# Patient Record
Sex: Female | Born: 1960 | Race: Black or African American | Hispanic: No | Smoking: Never smoker
Health system: Southern US, Community
[De-identification: ages and names within clinical notes are randomized; demographics above are authoritative.]

## PROBLEM LIST (undated history)

## (undated) ENCOUNTER — Emergency Department (HOSPITAL_BASED_OUTPATIENT_CLINIC_OR_DEPARTMENT_OTHER): Admission: EM | Payer: BC Managed Care – PPO | Source: Home / Self Care

## (undated) DIAGNOSIS — I10 Essential (primary) hypertension: Secondary | ICD-10-CM

## (undated) DIAGNOSIS — K219 Gastro-esophageal reflux disease without esophagitis: Secondary | ICD-10-CM

## (undated) DIAGNOSIS — N6452 Nipple discharge: Secondary | ICD-10-CM

---

## 1999-10-28 ENCOUNTER — Other Ambulatory Visit: Admission: RE | Admit: 1999-10-28 | Discharge: 1999-10-28 | Payer: Self-pay | Admitting: Obstetrics and Gynecology

## 2000-12-03 ENCOUNTER — Other Ambulatory Visit: Admission: RE | Admit: 2000-12-03 | Discharge: 2000-12-03 | Payer: Self-pay | Admitting: Obstetrics and Gynecology

## 2002-07-04 ENCOUNTER — Other Ambulatory Visit: Admission: RE | Admit: 2002-07-04 | Discharge: 2002-07-04 | Payer: Self-pay | Admitting: Obstetrics and Gynecology

## 2002-07-21 ENCOUNTER — Encounter: Admission: RE | Admit: 2002-07-21 | Discharge: 2002-07-21 | Payer: Self-pay | Admitting: Obstetrics and Gynecology

## 2002-07-21 ENCOUNTER — Encounter: Payer: Self-pay | Admitting: Obstetrics and Gynecology

## 2004-05-29 ENCOUNTER — Other Ambulatory Visit: Admission: RE | Admit: 2004-05-29 | Discharge: 2004-05-29 | Payer: Self-pay | Admitting: Obstetrics and Gynecology

## 2004-06-16 ENCOUNTER — Encounter: Admission: RE | Admit: 2004-06-16 | Discharge: 2004-06-16 | Payer: Self-pay | Admitting: Obstetrics and Gynecology

## 2005-07-30 ENCOUNTER — Other Ambulatory Visit: Admission: RE | Admit: 2005-07-30 | Discharge: 2005-07-30 | Payer: Self-pay | Admitting: Obstetrics and Gynecology

## 2005-07-30 ENCOUNTER — Encounter: Admission: RE | Admit: 2005-07-30 | Discharge: 2005-07-30 | Payer: Self-pay | Admitting: Obstetrics and Gynecology

## 2006-03-15 ENCOUNTER — Ambulatory Visit: Payer: Self-pay | Admitting: Orthopedic Surgery

## 2006-05-27 ENCOUNTER — Ambulatory Visit: Payer: Self-pay | Admitting: Orthopedic Surgery

## 2006-06-21 ENCOUNTER — Ambulatory Visit: Payer: Self-pay | Admitting: Orthopedic Surgery

## 2006-07-06 ENCOUNTER — Encounter: Admission: RE | Admit: 2006-07-06 | Discharge: 2006-07-06 | Payer: Self-pay | Admitting: Orthopedic Surgery

## 2006-07-22 ENCOUNTER — Encounter: Admission: RE | Admit: 2006-07-22 | Discharge: 2006-07-22 | Payer: Self-pay | Admitting: Orthopedic Surgery

## 2006-08-05 ENCOUNTER — Encounter: Admission: RE | Admit: 2006-08-05 | Discharge: 2006-08-05 | Payer: Self-pay | Admitting: Orthopedic Surgery

## 2006-08-16 ENCOUNTER — Encounter: Payer: Self-pay | Admitting: Orthopedic Surgery

## 2006-08-17 ENCOUNTER — Ambulatory Visit: Payer: Self-pay | Admitting: Orthopedic Surgery

## 2006-10-12 ENCOUNTER — Ambulatory Visit: Payer: Self-pay | Admitting: Orthopedic Surgery

## 2006-11-11 ENCOUNTER — Ambulatory Visit: Payer: Self-pay | Admitting: Orthopedic Surgery

## 2006-12-09 ENCOUNTER — Ambulatory Visit: Payer: Self-pay | Admitting: Orthopedic Surgery

## 2007-02-15 ENCOUNTER — Ambulatory Visit: Payer: Self-pay | Admitting: Orthopedic Surgery

## 2007-04-18 ENCOUNTER — Ambulatory Visit: Payer: Self-pay | Admitting: Orthopedic Surgery

## 2007-04-18 DIAGNOSIS — IMO0002 Reserved for concepts with insufficient information to code with codable children: Secondary | ICD-10-CM | POA: Insufficient documentation

## 2007-04-18 DIAGNOSIS — M549 Dorsalgia, unspecified: Secondary | ICD-10-CM | POA: Insufficient documentation

## 2007-05-09 ENCOUNTER — Encounter: Payer: Self-pay | Admitting: Orthopedic Surgery

## 2007-05-11 ENCOUNTER — Encounter: Payer: Self-pay | Admitting: Orthopedic Surgery

## 2007-05-17 ENCOUNTER — Telehealth: Payer: Self-pay | Admitting: Orthopedic Surgery

## 2007-05-24 ENCOUNTER — Telehealth: Payer: Self-pay | Admitting: Orthopedic Surgery

## 2007-05-26 ENCOUNTER — Telehealth: Payer: Self-pay | Admitting: Orthopedic Surgery

## 2007-06-09 ENCOUNTER — Ambulatory Visit: Payer: Self-pay | Admitting: Orthopedic Surgery

## 2007-08-22 ENCOUNTER — Telehealth: Payer: Self-pay | Admitting: Orthopedic Surgery

## 2007-08-22 ENCOUNTER — Encounter: Payer: Self-pay | Admitting: Orthopedic Surgery

## 2007-08-23 ENCOUNTER — Encounter: Payer: Self-pay | Admitting: Orthopedic Surgery

## 2007-09-06 ENCOUNTER — Ambulatory Visit: Payer: Self-pay | Admitting: Orthopedic Surgery

## 2007-09-06 DIAGNOSIS — M79609 Pain in unspecified limb: Secondary | ICD-10-CM | POA: Insufficient documentation

## 2007-10-11 ENCOUNTER — Encounter: Payer: Self-pay | Admitting: Orthopedic Surgery

## 2007-12-12 ENCOUNTER — Ambulatory Visit: Payer: Self-pay | Admitting: Orthopedic Surgery

## 2008-01-11 ENCOUNTER — Telehealth: Payer: Self-pay | Admitting: Orthopedic Surgery

## 2010-05-25 ENCOUNTER — Encounter: Payer: Self-pay | Admitting: Orthopedic Surgery

## 2012-05-11 ENCOUNTER — Other Ambulatory Visit: Payer: Self-pay | Admitting: Family Medicine

## 2012-05-11 DIAGNOSIS — Z1231 Encounter for screening mammogram for malignant neoplasm of breast: Secondary | ICD-10-CM

## 2012-06-13 ENCOUNTER — Ambulatory Visit
Admission: RE | Admit: 2012-06-13 | Discharge: 2012-06-13 | Disposition: A | Discharge status: Home / Self Care | Payer: BC Managed Care – PPO | Source: Ambulatory Visit | Attending: Family Medicine | Admitting: Family Medicine

## 2012-06-13 DIAGNOSIS — Z1231 Encounter for screening mammogram for malignant neoplasm of breast: Secondary | ICD-10-CM

## 2013-04-18 ENCOUNTER — Other Ambulatory Visit: Payer: Self-pay

## 2013-04-18 DIAGNOSIS — Z1231 Encounter for screening mammogram for malignant neoplasm of breast: Secondary | ICD-10-CM

## 2013-06-15 ENCOUNTER — Ambulatory Visit: Payer: BC Managed Care – PPO

## 2013-06-20 ENCOUNTER — Ambulatory Visit
Admission: RE | Admit: 2013-06-20 | Discharge: 2013-06-20 | Disposition: A | Discharge status: Home / Self Care | Payer: BC Managed Care – PPO | Source: Ambulatory Visit

## 2013-06-20 DIAGNOSIS — Z1231 Encounter for screening mammogram for malignant neoplasm of breast: Secondary | ICD-10-CM

## 2013-09-05 ENCOUNTER — Encounter: Payer: Self-pay | Admitting: Podiatry

## 2013-09-05 ENCOUNTER — Ambulatory Visit (INDEPENDENT_AMBULATORY_CARE_PROVIDER_SITE_OTHER): Payer: BC Managed Care – PPO | Admitting: Podiatry

## 2013-09-05 VITALS — BP 129/89 | HR 78

## 2013-09-05 DIAGNOSIS — M79609 Pain in unspecified limb: Secondary | ICD-10-CM

## 2013-09-05 DIAGNOSIS — M659 Synovitis and tenosynovitis, unspecified: Secondary | ICD-10-CM

## 2013-09-05 DIAGNOSIS — M21969 Unspecified acquired deformity of unspecified lower leg: Secondary | ICD-10-CM | POA: Insufficient documentation
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
## 2013-09-05 NOTE — Patient Instructions (Signed)
Seen for pain in both feet.  Noted of early arthritic change. Noted of unstable first Metatarsal bone. Metatarsal binder x 1 pr dispensed.  Need custom orthotics. Will contact patient with info.

## 2013-09-07 ENCOUNTER — Emergency Department (HOSPITAL_BASED_OUTPATIENT_CLINIC_OR_DEPARTMENT_OTHER)
Admission: EM | Admit: 2013-09-07 | Discharge: 2013-09-07 | Disposition: A | Discharge status: Home / Self Care | Payer: BC Managed Care – PPO | Attending: Emergency Medicine | Admitting: Emergency Medicine

## 2013-09-07 ENCOUNTER — Encounter (HOSPITAL_BASED_OUTPATIENT_CLINIC_OR_DEPARTMENT_OTHER): Payer: Self-pay | Admitting: Emergency Medicine

## 2013-09-07 DIAGNOSIS — I10 Essential (primary) hypertension: Secondary | ICD-10-CM | POA: Insufficient documentation

## 2013-09-07 DIAGNOSIS — K219 Gastro-esophageal reflux disease without esophagitis: Secondary | ICD-10-CM | POA: Insufficient documentation

## 2013-09-07 DIAGNOSIS — R079 Chest pain, unspecified: Secondary | ICD-10-CM | POA: Insufficient documentation

## 2013-09-07 DIAGNOSIS — Z79899 Other long term (current) drug therapy: Secondary | ICD-10-CM | POA: Insufficient documentation

## 2013-09-07 HISTORY — DX: Essential (primary) hypertension: I10

## 2013-09-07 HISTORY — DX: Gastro-esophageal reflux disease without esophagitis: K21.9

## 2013-09-07 LAB — CBC WITH DIFFERENTIAL/PLATELET
Basophils Absolute: 0 10*3/uL (ref 0.0–0.1)
Basophils Relative: 0 % (ref 0–1)
Eosinophils Absolute: 0.1 10*3/uL (ref 0.0–0.7)
Eosinophils Relative: 1 % (ref 0–5)
HCT: 35 % — ABNORMAL LOW (ref 36.0–46.0)
HEMOGLOBIN: 12.2 g/dL (ref 12.0–15.0)
LYMPHS ABS: 1.8 10*3/uL (ref 0.7–4.0)
Lymphocytes Relative: 33 % (ref 12–46)
MCH: 25.7 pg — ABNORMAL LOW (ref 26.0–34.0)
MCHC: 34.9 g/dL (ref 30.0–36.0)
MCV: 73.8 fL — ABNORMAL LOW (ref 78.0–100.0)
MONOS PCT: 8 % (ref 3–12)
Monocytes Absolute: 0.4 10*3/uL (ref 0.1–1.0)
NEUTROS ABS: 3.3 10*3/uL (ref 1.7–7.7)
NEUTROS PCT: 58 % (ref 43–77)
Platelets: 251 10*3/uL (ref 150–400)
RBC: 4.74 MIL/uL (ref 3.87–5.11)
RDW: 16.5 % — ABNORMAL HIGH (ref 11.5–15.5)
WBC: 5.6 10*3/uL (ref 4.0–10.5)

## 2013-09-07 LAB — BASIC METABOLIC PANEL
BUN: 10 mg/dL (ref 6–23)
CHLORIDE: 98 meq/L (ref 96–112)
CO2: 25 mEq/L (ref 19–32)
Calcium: 10 mg/dL (ref 8.4–10.5)
Creatinine, Ser: 0.7 mg/dL (ref 0.50–1.10)
GFR calc Af Amer: >90 mL/min (ref >90)
GFR calc non Af Amer: >90 mL/min (ref >90)
GLUCOSE: 105 mg/dL — AB (ref 70–99)
POTASSIUM: 3.4 meq/L — AB (ref 3.7–5.3)
Sodium: 139 mEq/L (ref 137–147)

## 2013-09-07 LAB — TROPONIN I

## 2013-09-07 NOTE — ED Notes (Signed)
Chest pain since yesterday

## 2013-09-07 NOTE — Discharge Instructions (Signed)
Chest Pain (Nonspecific) °It is often hard to give a specific diagnosis for the cause of chest pain. There is always a chance that your pain could be related to something serious, such as a heart attack or a blood clot in the lungs. You need to follow up with your caregiver for further evaluation. °CAUSES  °· Heartburn. °· Pneumonia or bronchitis. °· Anxiety or stress. °· Inflammation around your heart (pericarditis) or lung (pleuritis or pleurisy). °· A blood clot in the lung. °· A collapsed lung (pneumothorax). It can develop suddenly on its own (spontaneous pneumothorax) or from injury (trauma) to the chest. °· Shingles infection (herpes zoster virus). °The chest wall is composed of bones, muscles, and cartilage. Any of these can be the source of the pain. °· The bones can be bruised by injury. °· The muscles or cartilage can be strained by coughing or overwork. °· The cartilage can be affected by inflammation and become sore (costochondritis). °DIAGNOSIS  °Lab tests or other studies, such as X-rays, electrocardiography, stress testing, or cardiac imaging, may be needed to find the cause of your pain.  °TREATMENT  °· Treatment depends on what may be causing your chest pain. Treatment may include: °· Acid blockers for heartburn. °· Anti-inflammatory medicine. °· Pain medicine for inflammatory conditions. °· Antibiotics if an infection is present. °· You may be advised to change lifestyle habits. This includes stopping smoking and avoiding alcohol, caffeine, and chocolate. °· You may be advised to keep your head raised (elevated) when sleeping. This reduces the chance of acid going backward from your stomach into your esophagus. °· Most of the time, nonspecific chest pain will improve within 2 to 3 days with rest and mild pain medicine. °HOME CARE INSTRUCTIONS  °· If antibiotics were prescribed, take your antibiotics as directed. Finish them even if you start to feel better. °· For the next few days, avoid physical  activities that bring on chest pain. Continue physical activities as directed. °· Do not smoke. °· Avoid drinking alcohol. °· Only take over-the-counter or prescription medicine for pain, discomfort, or fever as directed by your caregiver. °· Follow your caregiver's suggestions for further testing if your chest pain does not go away. °· Keep any follow-up appointments you made. If you do not go to an appointment, you could develop lasting (chronic) problems with pain. If there is any problem keeping an appointment, you must call to reschedule. °SEEK MEDICAL CARE IF:  °· You think you are having problems from the medicine you are taking. Read your medicine instructions carefully. °· Your chest pain does not go away, even after treatment. °· You develop a rash with blisters on your chest. °SEEK IMMEDIATE MEDICAL CARE IF:  °· You have increased chest pain or pain that spreads to your arm, neck, jaw, back, or abdomen. °· You develop shortness of breath, an increasing cough, or you are coughing up blood. °· You have severe back or abdominal pain, feel nauseous, or vomit. °· You develop severe weakness, fainting, or chills. °· You have a fever. °THIS IS AN EMERGENCY. Do not wait to see if the pain will go away. Get medical help at once. Call your local emergency services (911 in [place] Do not drive yourself to the hospital. °MAKE SURE YOU:  °· Understand these instructions. °· Will watch your condition. °· Will get help right away if you are not doing well or get worse. °Document Released: 01/28/2005 Document Revised: 07/13/2011 Document Reviewed: 11/24/2007 °[employer]® Patient Information ©2014 [employer]   [employer]. ° °

## 2013-09-07 NOTE — ED Provider Notes (Signed)
CSN: [id number]     Arrival date & time 09/07/13  1840 History  This chart was scribed for Candyce ChurnJohn David Lorina Duffner III, * by Luisa DagoPriscilla Tutu, ED Scribe. This patient was seen in room MH06/MH06 and the patient's care was started at 8:14 PM.    Chief Complaint  Patient presents with  . Chest Pain   Patient is a 53 y.o. female presenting with chest pain. The history is provided by the patient. No language interpreter was used.  Chest Pain Pain location:  L chest and R chest Pain quality: burning   Pain radiates to:  Does not radiate Pain radiates to the back: no   Pain severity:  Moderate Onset quality:  Gradual Duration:  1 day Timing:  Intermittent Relieved by: drinking water.  HPI Comments: Brittney MangesBonnie K Long is a 53 y.o. female who presents to the Emergency Department complaining of worsening intermittent chest pain that started yesterday. Pt states that she experiences these episodes of chest pain every 3-4 months. This current episode happened after taking her medication. She states that when the symptoms occur she experiences a dry mouth. Pt states that once she drank some water and the chest pain subsided. She states that she is currently taking acid reflux medication and BP medication. Pt states that she is concerned that she may be having a heart attack and would like to know what is causing her discomfort. She is not currently having any chest pain at the moment.    Past Medical History  Diagnosis Date  . Hypertension   . GERD (gastroesophageal reflux disease)    History reviewed. No pertinent past surgical history. No family history on file. History  Substance Use Topics  . Smoking status: Never Smoker   . Smokeless tobacco: Never Used  . Alcohol Use: No   OB History   Grav Para Term Preterm Abortions TAB SAB Ect Mult Living                 Review of Systems  Cardiovascular: Positive for chest pain.  All other systems reviewed and are negative.     Allergies  Review of  patient's allergies indicates no known allergies.  Home Medications   Prior to Admission medications   Medication Sig Start Date End Date Taking? Authorizing Provider  amLODipine (NORVASC) 10 MG tablet Take 10 mg by mouth daily.  08/07/13   Historical Provider, MD  hydrochlorothiazide (HYDRODIURIL) 25 MG tablet Take 25 mg by mouth daily.  09/01/13   Historical Provider, MD   BP 136/96  Pulse 95  Temp(Src) 98.9 F (37.2 C) (Oral)  Resp 20  Ht 5\' 6"  (1.676 m)  Wt 191 lb (86.637 kg)  BMI 30.84 kg/m2  SpO2 100%  Physical Exam  Nursing note and vitals reviewed. Constitutional: She is oriented to person, place, and time. She appears well-developed and well-nourished. No distress.  HENT:  Head: Normocephalic and atraumatic.  Mouth/Throat: Oropharynx is clear and moist.  Eyes: Conjunctivae are normal. Pupils are equal, round, and reactive to light. No scleral icterus.  Neck: Neck supple.  Cardiovascular: Normal rate, regular rhythm, normal heart sounds and intact distal pulses.   No murmur heard. Pulmonary/Chest: Effort normal and breath sounds normal. No stridor. No respiratory distress. She has no rales.  Abdominal: Soft. Bowel sounds are normal. She exhibits no distension. There is no tenderness.  Musculoskeletal: Normal range of motion.  Neurological: She is alert and oriented to person, place, and time.  Skin: Skin is warm and  dry. No rash noted.  Psychiatric: She has a normal mood and affect. Her behavior is normal.    ED Course  Procedures (including critical care time)  DIAGNOSTIC STUDIES: Oxygen Saturation is 100% on RA, normal by my interpretation.    COORDINATION OF CARE: 8:22 PM-Patient / Family / Caregiver informed of clinical course, understand medical decision-making process, and agree with plan.  Labs Review Labs Reviewed  CBC WITH DIFFERENTIAL - Abnormal; Notable for the following:    HCT 35.0 (*)    MCV 73.8 (*)    MCH 25.7 (*)    RDW 16.5 (*)    All other  components within normal limits  BASIC METABOLIC PANEL - Abnormal; Notable for the following:    Potassium 3.4 (*)    Glucose, Bld 105 (*)    All other components within normal limits  TROPONIN I    Imaging Review No results found.   EKG Interpretation   Date/Time:  Thursday Sep 07 2013 18:52:56 EDT Ventricular Rate:  90 PR Interval:  140 QRS Duration: 88 QT Interval:  378 QTC Calculation: 462 R Axis:   43 Text Interpretation:  Sinus rhythm with occasional Premature ventricular  complexes Nonspecific ST abnormality Abnormal ECG No old tracing to  compare Confirmed by [facility]WOFF[facility]  MD, TREY ([id number]) on 09/07/2013 8:20:37 PM      MDM   Final diagnoses:  Chest pain    53 year old female presenting with chest pain. No chest pain for past 2 days. EKG shows some nonspecific changes without prior for comparison. Her history is not at all consistent with ACS. Her symptoms sound most likely GI related. Being as she has been symptom-free for 2 days, she is appropriate for outpatient management and followup with her PCP.  I personally performed the services described in this documentation, which was scribed in my presence. The recorded information has been reviewed and is accurate.     Candyce ChurnJohn David Aki Abalos III, MD 09/08/13 [phone]

## 2013-09-07 NOTE — ED Notes (Signed)
Pt c/o rt side co this past Tuesday after taking meds, pain went away after drinking water,  Has happened 2-3 times over past several months

## 2013-09-13 ENCOUNTER — Encounter (HOSPITAL_BASED_OUTPATIENT_CLINIC_OR_DEPARTMENT_OTHER): Payer: Self-pay | Admitting: Emergency Medicine

## 2013-09-13 ENCOUNTER — Emergency Department (HOSPITAL_BASED_OUTPATIENT_CLINIC_OR_DEPARTMENT_OTHER): Payer: BC Managed Care – PPO

## 2013-09-13 ENCOUNTER — Emergency Department (HOSPITAL_BASED_OUTPATIENT_CLINIC_OR_DEPARTMENT_OTHER)
Admission: EM | Admit: 2013-09-13 | Discharge: 2013-09-13 | Disposition: A | Discharge status: Home / Self Care | Payer: BC Managed Care – PPO | Attending: Emergency Medicine | Admitting: Emergency Medicine

## 2013-09-13 DIAGNOSIS — Z79899 Other long term (current) drug therapy: Secondary | ICD-10-CM | POA: Insufficient documentation

## 2013-09-13 DIAGNOSIS — Z8719 Personal history of other diseases of the digestive system: Secondary | ICD-10-CM | POA: Insufficient documentation

## 2013-09-13 DIAGNOSIS — I10 Essential (primary) hypertension: Secondary | ICD-10-CM | POA: Insufficient documentation

## 2013-09-13 DIAGNOSIS — R071 Chest pain on breathing: Secondary | ICD-10-CM | POA: Insufficient documentation

## 2013-09-13 DIAGNOSIS — R0789 Other chest pain: Secondary | ICD-10-CM

## 2013-09-13 NOTE — Discharge Instructions (Signed)

## 2013-09-13 NOTE — ED Notes (Signed)
Pt sts she does not have pain, but "It feels like something is there that shouldn't be", points to below the right breast. Denies actual breast pain.
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
## 2013-09-13 NOTE — ED Notes (Signed)
Pt reports lower right breast pain x 4 days

## 2013-09-13 NOTE — ED Notes (Signed)
Patient transported to X-ray 

## 2013-09-14 NOTE — ED Provider Notes (Signed)
Medical screening examination/treatment/procedure(s) were performed by non-physician practitioner and as supervising physician I was immediately available for consultation/collaboration.     Shiv Shuey, MD 09/14/13 0722 

## 2014-05-29 ENCOUNTER — Other Ambulatory Visit: Payer: Self-pay

## 2014-05-29 DIAGNOSIS — Z1231 Encounter for screening mammogram for malignant neoplasm of breast: Secondary | ICD-10-CM

## 2014-06-21 ENCOUNTER — Ambulatory Visit: Payer: Self-pay

## 2014-06-28 ENCOUNTER — Ambulatory Visit
Admission: RE | Admit: 2014-06-28 | Discharge: 2014-06-28 | Disposition: A | Discharge status: Home / Self Care | Payer: PRIVATE HEALTH INSURANCE | Source: Ambulatory Visit

## 2014-06-28 DIAGNOSIS — Z1231 Encounter for screening mammogram for malignant neoplasm of breast: Secondary | ICD-10-CM

## 2015-05-22 ENCOUNTER — Other Ambulatory Visit: Payer: Self-pay

## 2015-05-31 ENCOUNTER — Other Ambulatory Visit: Payer: Self-pay

## 2015-05-31 DIAGNOSIS — Z1231 Encounter for screening mammogram for malignant neoplasm of breast: Secondary | ICD-10-CM

## 2015-07-02 ENCOUNTER — Ambulatory Visit
Admission: RE | Admit: 2015-07-02 | Discharge: 2015-07-02 | Disposition: A | Discharge status: Home / Self Care | Payer: BLUE CROSS/BLUE SHIELD | Source: Ambulatory Visit

## 2015-07-02 DIAGNOSIS — Z1231 Encounter for screening mammogram for malignant neoplasm of breast: Secondary | ICD-10-CM

## 2015-11-11 DIAGNOSIS — H43812 Vitreous degeneration, left eye: Secondary | ICD-10-CM | POA: Diagnosis not present

## 2015-11-14 DIAGNOSIS — H43812 Vitreous degeneration, left eye: Secondary | ICD-10-CM | POA: Diagnosis not present

## 2015-11-19 DIAGNOSIS — H5319 Other subjective visual disturbances: Secondary | ICD-10-CM | POA: Diagnosis not present

## 2015-11-19 DIAGNOSIS — H35712 Central serous chorioretinopathy, left eye: Secondary | ICD-10-CM | POA: Diagnosis not present

## 2015-12-05 DIAGNOSIS — Z0000 Encounter for general adult medical examination without abnormal findings: Secondary | ICD-10-CM | POA: Diagnosis not present

## 2015-12-05 DIAGNOSIS — Z13220 Encounter for screening for lipoid disorders: Secondary | ICD-10-CM | POA: Diagnosis not present

## 2015-12-05 DIAGNOSIS — Z1159 Encounter for screening for other viral diseases: Secondary | ICD-10-CM | POA: Diagnosis not present

## 2015-12-05 DIAGNOSIS — I10 Essential (primary) hypertension: Secondary | ICD-10-CM | POA: Diagnosis not present

## 2015-12-27 DIAGNOSIS — H35712 Central serous chorioretinopathy, left eye: Secondary | ICD-10-CM | POA: Diagnosis not present

## 2016-01-14 DIAGNOSIS — H35712 Central serous chorioretinopathy, left eye: Secondary | ICD-10-CM | POA: Diagnosis not present

## 2016-01-14 DIAGNOSIS — Z5181 Encounter for therapeutic drug level monitoring: Secondary | ICD-10-CM | POA: Diagnosis not present

## 2016-01-14 DIAGNOSIS — I10 Essential (primary) hypertension: Secondary | ICD-10-CM | POA: Diagnosis not present

## 2016-01-23 DIAGNOSIS — H35712 Central serous chorioretinopathy, left eye: Secondary | ICD-10-CM | POA: Diagnosis not present

## 2016-02-19 DIAGNOSIS — Z01419 Encounter for gynecological examination (general) (routine) without abnormal findings: Secondary | ICD-10-CM | POA: Diagnosis not present

## 2016-02-19 DIAGNOSIS — Z6830 Body mass index (BMI) 30.0-30.9, adult: Secondary | ICD-10-CM | POA: Diagnosis not present

## 2016-03-19 DIAGNOSIS — I10 Essential (primary) hypertension: Secondary | ICD-10-CM | POA: Diagnosis not present

## 2016-03-19 DIAGNOSIS — H35712 Central serous chorioretinopathy, left eye: Secondary | ICD-10-CM | POA: Diagnosis not present

## 2016-03-19 DIAGNOSIS — Z5181 Encounter for therapeutic drug level monitoring: Secondary | ICD-10-CM | POA: Diagnosis not present

## 2016-04-01 DIAGNOSIS — H35712 Central serous chorioretinopathy, left eye: Secondary | ICD-10-CM | POA: Diagnosis not present

## 2016-04-10 DIAGNOSIS — H5310 Unspecified subjective visual disturbances: Secondary | ICD-10-CM | POA: Diagnosis not present

## 2016-04-21 DIAGNOSIS — H35712 Central serous chorioretinopathy, left eye: Secondary | ICD-10-CM | POA: Diagnosis not present

## 2016-05-19 ENCOUNTER — Other Ambulatory Visit: Payer: Self-pay | Admitting: Family Medicine

## 2016-05-19 DIAGNOSIS — Z1231 Encounter for screening mammogram for malignant neoplasm of breast: Secondary | ICD-10-CM

## 2016-06-09 DIAGNOSIS — I10 Essential (primary) hypertension: Secondary | ICD-10-CM | POA: Diagnosis not present

## 2016-06-09 DIAGNOSIS — E878 Other disorders of electrolyte and fluid balance, not elsewhere classified: Secondary | ICD-10-CM | POA: Diagnosis not present

## 2016-06-09 DIAGNOSIS — Z5181 Encounter for therapeutic drug level monitoring: Secondary | ICD-10-CM | POA: Diagnosis not present

## 2016-06-09 DIAGNOSIS — H35712 Central serous chorioretinopathy, left eye: Secondary | ICD-10-CM | POA: Diagnosis not present

## 2016-06-10 DIAGNOSIS — H35712 Central serous chorioretinopathy, left eye: Secondary | ICD-10-CM | POA: Diagnosis not present

## 2016-07-03 ENCOUNTER — Ambulatory Visit
Admission: RE | Admit: 2016-07-03 | Discharge: 2016-07-03 | Disposition: A | Discharge status: Home / Self Care | Payer: BLUE CROSS/BLUE SHIELD | Source: Ambulatory Visit | Attending: Family Medicine | Admitting: Family Medicine

## 2016-07-03 DIAGNOSIS — Z1231 Encounter for screening mammogram for malignant neoplasm of breast: Secondary | ICD-10-CM | POA: Diagnosis not present

## 2016-07-03 DIAGNOSIS — H35712 Central serous chorioretinopathy, left eye: Secondary | ICD-10-CM | POA: Diagnosis not present

## 2016-08-14 DIAGNOSIS — H35712 Central serous chorioretinopathy, left eye: Secondary | ICD-10-CM | POA: Diagnosis not present

## 2016-10-13 DIAGNOSIS — H35712 Central serous chorioretinopathy, left eye: Secondary | ICD-10-CM | POA: Diagnosis not present

## 2016-10-20 DIAGNOSIS — N644 Mastodynia: Secondary | ICD-10-CM | POA: Diagnosis not present

## 2016-10-21 ENCOUNTER — Other Ambulatory Visit: Payer: Self-pay | Admitting: Obstetrics and Gynecology

## 2016-10-21 DIAGNOSIS — N6452 Nipple discharge: Secondary | ICD-10-CM

## 2016-10-22 ENCOUNTER — Ambulatory Visit
Admission: RE | Admit: 2016-10-22 | Discharge: 2016-10-22 | Disposition: A | Discharge status: Home / Self Care | Payer: BLUE CROSS/BLUE SHIELD | Source: Ambulatory Visit | Attending: Obstetrics and Gynecology | Admitting: Obstetrics and Gynecology

## 2016-10-22 DIAGNOSIS — N6452 Nipple discharge: Secondary | ICD-10-CM

## 2016-10-22 DIAGNOSIS — R928 Other abnormal and inconclusive findings on diagnostic imaging of breast: Secondary | ICD-10-CM | POA: Diagnosis not present

## 2016-10-22 HISTORY — DX: Nipple discharge: N64.52

## 2016-12-29 DIAGNOSIS — Z0000 Encounter for general adult medical examination without abnormal findings: Secondary | ICD-10-CM | POA: Diagnosis not present

## 2016-12-29 DIAGNOSIS — E559 Vitamin D deficiency, unspecified: Secondary | ICD-10-CM | POA: Diagnosis not present

## 2016-12-29 DIAGNOSIS — Z13220 Encounter for screening for lipoid disorders: Secondary | ICD-10-CM | POA: Diagnosis not present

## 2016-12-29 DIAGNOSIS — Z5181 Encounter for therapeutic drug level monitoring: Secondary | ICD-10-CM | POA: Diagnosis not present

## 2017-01-19 DIAGNOSIS — I10 Essential (primary) hypertension: Secondary | ICD-10-CM | POA: Diagnosis not present

## 2017-02-03 DIAGNOSIS — H5319 Other subjective visual disturbances: Secondary | ICD-10-CM | POA: Diagnosis not present

## 2017-02-03 DIAGNOSIS — H35712 Central serous chorioretinopathy, left eye: Secondary | ICD-10-CM | POA: Diagnosis not present

## 2017-04-06 DIAGNOSIS — E7800 Pure hypercholesterolemia, unspecified: Secondary | ICD-10-CM | POA: Diagnosis not present

## 2017-04-06 DIAGNOSIS — H35712 Central serous chorioretinopathy, left eye: Secondary | ICD-10-CM | POA: Diagnosis not present

## 2017-04-06 DIAGNOSIS — N951 Menopausal and female climacteric states: Secondary | ICD-10-CM | POA: Diagnosis not present

## 2017-04-06 DIAGNOSIS — I10 Essential (primary) hypertension: Secondary | ICD-10-CM | POA: Diagnosis not present

## 2017-04-06 DIAGNOSIS — E559 Vitamin D deficiency, unspecified: Secondary | ICD-10-CM | POA: Diagnosis not present

## 2017-05-05 DIAGNOSIS — H35712 Central serous chorioretinopathy, left eye: Secondary | ICD-10-CM | POA: Diagnosis not present

## 2017-05-10 DIAGNOSIS — Z6831 Body mass index (BMI) 31.0-31.9, adult: Secondary | ICD-10-CM | POA: Diagnosis not present

## 2017-05-10 DIAGNOSIS — Z01419 Encounter for gynecological examination (general) (routine) without abnormal findings: Secondary | ICD-10-CM | POA: Diagnosis not present

## 2017-08-05 DIAGNOSIS — I10 Essential (primary) hypertension: Secondary | ICD-10-CM | POA: Diagnosis not present

## 2017-08-05 DIAGNOSIS — E559 Vitamin D deficiency, unspecified: Secondary | ICD-10-CM | POA: Diagnosis not present

## 2017-08-05 DIAGNOSIS — H35712 Central serous chorioretinopathy, left eye: Secondary | ICD-10-CM | POA: Diagnosis not present

## 2017-08-27 ENCOUNTER — Other Ambulatory Visit: Payer: Self-pay | Admitting: Family Medicine

## 2017-08-27 DIAGNOSIS — Z1231 Encounter for screening mammogram for malignant neoplasm of breast: Secondary | ICD-10-CM

## 2017-09-02 DIAGNOSIS — H35712 Central serous chorioretinopathy, left eye: Secondary | ICD-10-CM | POA: Diagnosis not present

## 2017-09-23 ENCOUNTER — Ambulatory Visit
Admission: RE | Admit: 2017-09-23 | Discharge: 2017-09-23 | Disposition: A | Discharge status: Home / Self Care | Payer: BLUE CROSS/BLUE SHIELD | Source: Ambulatory Visit | Attending: Family Medicine | Admitting: Family Medicine

## 2017-09-23 DIAGNOSIS — Z1231 Encounter for screening mammogram for malignant neoplasm of breast: Secondary | ICD-10-CM

## 2018-02-24 DIAGNOSIS — Z23 Encounter for immunization: Secondary | ICD-10-CM | POA: Diagnosis not present

## 2018-02-24 DIAGNOSIS — E559 Vitamin D deficiency, unspecified: Secondary | ICD-10-CM | POA: Diagnosis not present

## 2018-02-24 DIAGNOSIS — Z13220 Encounter for screening for lipoid disorders: Secondary | ICD-10-CM | POA: Diagnosis not present

## 2018-02-24 DIAGNOSIS — Z5181 Encounter for therapeutic drug level monitoring: Secondary | ICD-10-CM | POA: Diagnosis not present

## 2018-03-03 DIAGNOSIS — H35712 Central serous chorioretinopathy, left eye: Secondary | ICD-10-CM | POA: Diagnosis not present

## 2018-05-06 ENCOUNTER — Other Ambulatory Visit: Payer: Self-pay | Admitting: Family Medicine

## 2018-05-06 DIAGNOSIS — Z1231 Encounter for screening mammogram for malignant neoplasm of breast: Secondary | ICD-10-CM

## 2018-06-23 IMAGING — MG DIGITAL SCREENING BILATERAL MAMMOGRAM WITH TOMO AND CAD
8 series · 8 of 24 positions shown · non-contrast
Comparison: Previous exam(s).

CLINICAL DATA: Screening.

EXAM:
DIGITAL SCREENING BILATERAL MAMMOGRAM WITH TOMO AND CAD

[R MLO synth-2D]
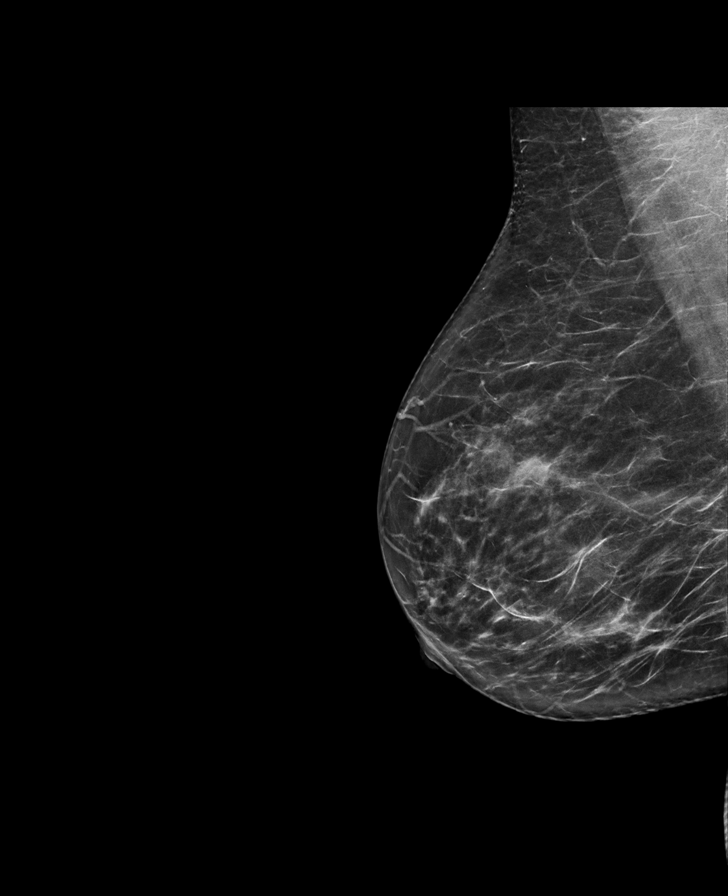

[L CC synth-2D]
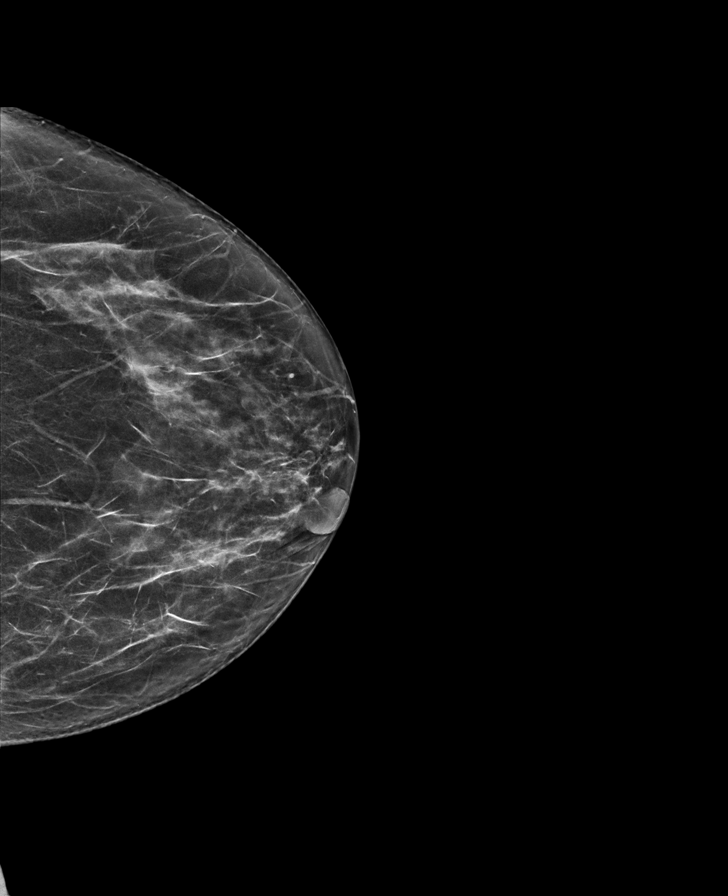

[R CC synth-2D]
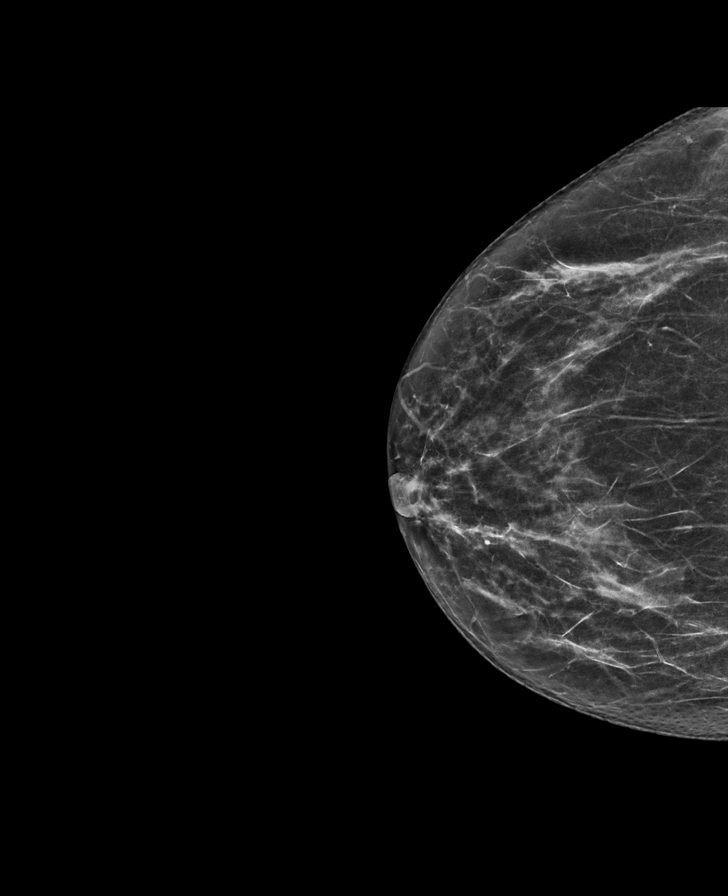

[L MLO synth-2D]
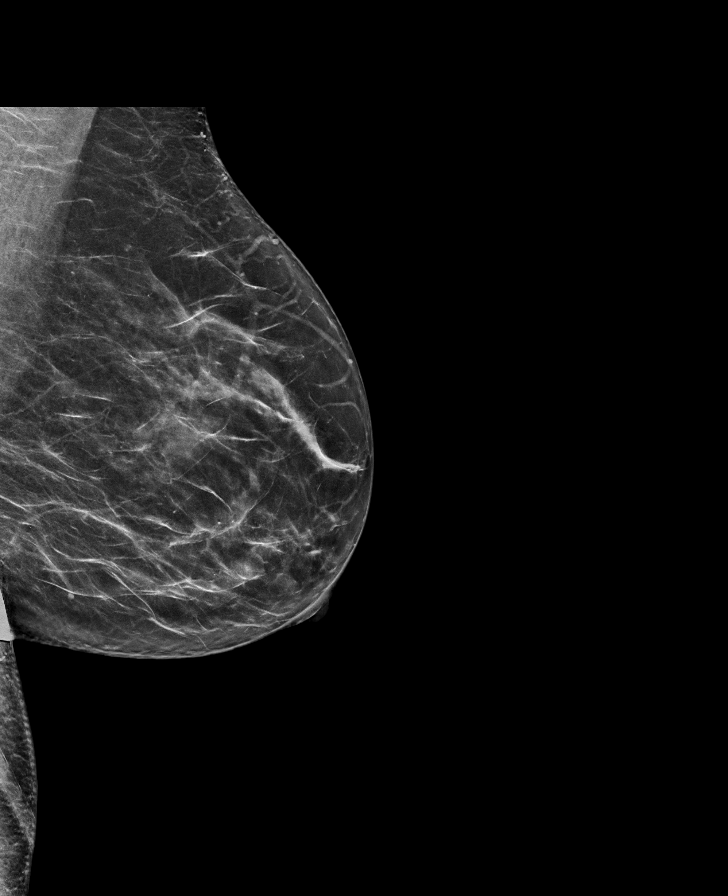

[R MLO tomo · tomo slice 37/73.0]
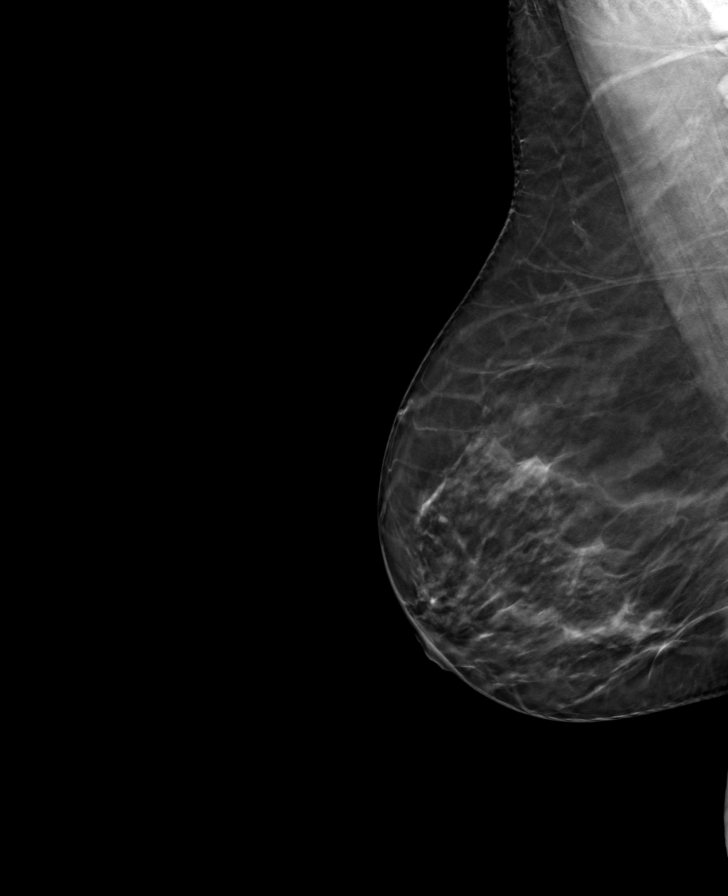

[L CC tomo · tomo slice 33/66.0]
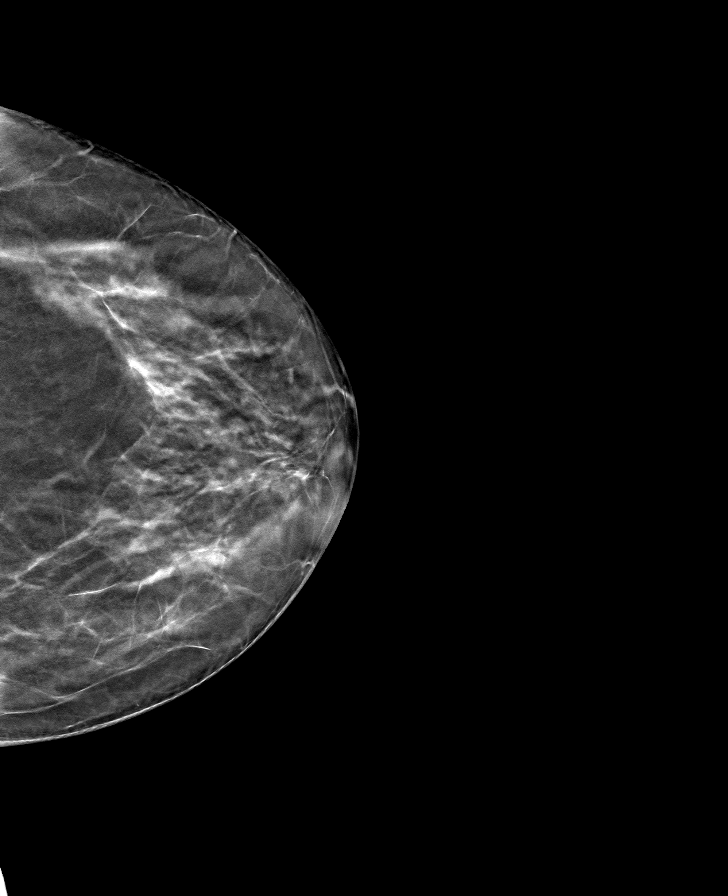

[L MLO tomo · tomo slice 35/70.0]
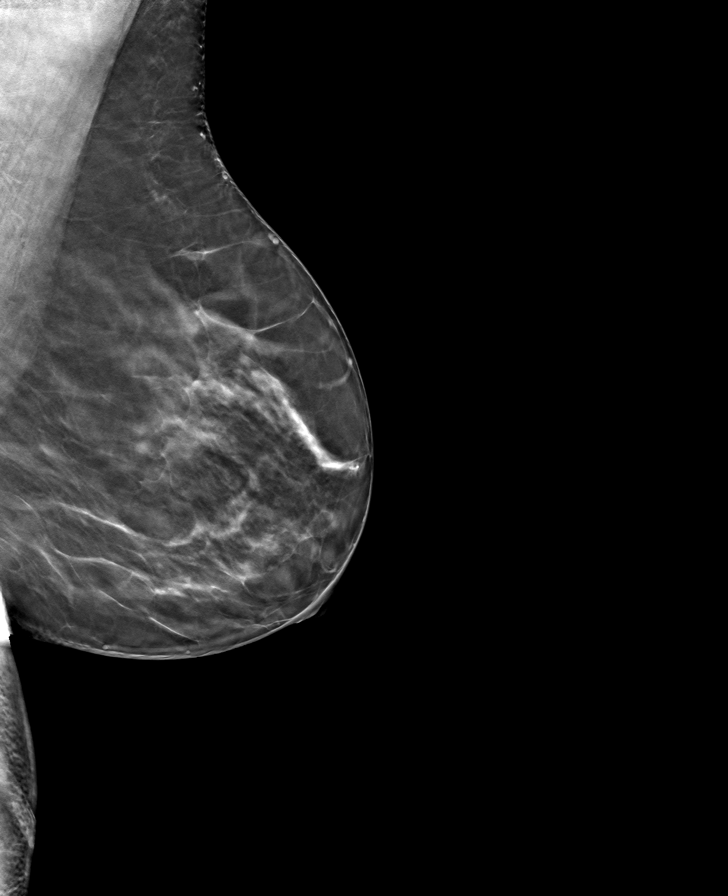

[R CC tomo · tomo slice 31/61.0]
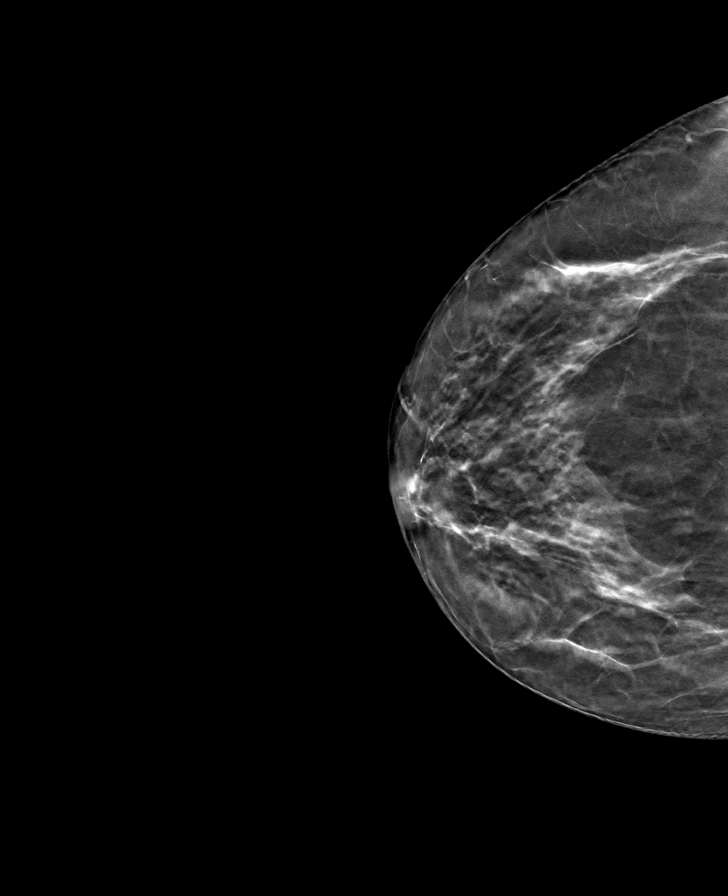

[8 of 24 positions shown; findings below may reference images not displayed]

ACR Breast Density Category b: There are scattered areas of
fibroglandular density.
FINDINGS: There are no findings suspicious for malignancy. Images were
processed with CAD.
IMPRESSION: No mammographic evidence of malignancy. A result letter of this
screening mammogram will be mailed directly to the patient.

RECOMMENDATION:
Screening mammogram in one year. (Code:[id number])

BI-RADS CATEGORY  1: Negative.

## 2018-06-30 DIAGNOSIS — Z01419 Encounter for gynecological examination (general) (routine) without abnormal findings: Secondary | ICD-10-CM | POA: Diagnosis not present

## 2018-06-30 DIAGNOSIS — Z1151 Encounter for screening for human papillomavirus (HPV): Secondary | ICD-10-CM | POA: Diagnosis not present

## 2018-06-30 DIAGNOSIS — Z6833 Body mass index (BMI) 33.0-33.9, adult: Secondary | ICD-10-CM | POA: Diagnosis not present

## 2018-06-30 DIAGNOSIS — Z113 Encounter for screening for infections with a predominantly sexual mode of transmission: Secondary | ICD-10-CM | POA: Diagnosis not present

## 2018-06-30 DIAGNOSIS — Z124 Encounter for screening for malignant neoplasm of cervix: Secondary | ICD-10-CM | POA: Diagnosis not present

## 2018-08-30 DIAGNOSIS — I10 Essential (primary) hypertension: Secondary | ICD-10-CM | POA: Diagnosis not present

## 2018-08-30 DIAGNOSIS — Z5181 Encounter for therapeutic drug level monitoring: Secondary | ICD-10-CM | POA: Diagnosis not present

## 2018-08-30 DIAGNOSIS — Q140 Congenital malformation of vitreous humor: Secondary | ICD-10-CM | POA: Diagnosis not present

## 2018-08-30 DIAGNOSIS — H35712 Central serous chorioretinopathy, left eye: Secondary | ICD-10-CM | POA: Diagnosis not present

## 2018-09-06 DIAGNOSIS — I10 Essential (primary) hypertension: Secondary | ICD-10-CM | POA: Diagnosis not present

## 2018-09-06 DIAGNOSIS — Z5181 Encounter for therapeutic drug level monitoring: Secondary | ICD-10-CM | POA: Diagnosis not present

## 2018-09-27 ENCOUNTER — Ambulatory Visit
Admission: RE | Admit: 2018-09-27 | Discharge: 2018-09-27 | Disposition: A | Discharge status: Home / Self Care | Payer: BLUE CROSS/BLUE SHIELD | Source: Ambulatory Visit | Attending: Family Medicine | Admitting: Family Medicine

## 2018-09-27 ENCOUNTER — Other Ambulatory Visit: Payer: Self-pay

## 2018-09-27 DIAGNOSIS — Z1231 Encounter for screening mammogram for malignant neoplasm of breast: Secondary | ICD-10-CM | POA: Diagnosis not present

## 2018-10-06 DIAGNOSIS — H35712 Central serous chorioretinopathy, left eye: Secondary | ICD-10-CM | POA: Diagnosis not present

## 2018-12-08 DIAGNOSIS — H35712 Central serous chorioretinopathy, left eye: Secondary | ICD-10-CM | POA: Diagnosis not present

## 2019-04-25 DIAGNOSIS — Z20828 Contact with and (suspected) exposure to other viral communicable diseases: Secondary | ICD-10-CM | POA: Diagnosis not present

## 2019-05-18 DIAGNOSIS — H35712 Central serous chorioretinopathy, left eye: Secondary | ICD-10-CM | POA: Diagnosis not present

## 2019-05-23 DIAGNOSIS — Z20828 Contact with and (suspected) exposure to other viral communicable diseases: Secondary | ICD-10-CM | POA: Diagnosis not present

## 2019-06-08 DIAGNOSIS — Z5181 Encounter for therapeutic drug level monitoring: Secondary | ICD-10-CM | POA: Diagnosis not present

## 2019-06-08 DIAGNOSIS — E559 Vitamin D deficiency, unspecified: Secondary | ICD-10-CM | POA: Diagnosis not present

## 2019-06-08 DIAGNOSIS — I10 Essential (primary) hypertension: Secondary | ICD-10-CM | POA: Diagnosis not present

## 2019-06-08 DIAGNOSIS — H35712 Central serous chorioretinopathy, left eye: Secondary | ICD-10-CM | POA: Diagnosis not present

## 2019-06-12 DIAGNOSIS — H35712 Central serous chorioretinopathy, left eye: Secondary | ICD-10-CM | POA: Diagnosis not present

## 2019-06-13 DIAGNOSIS — I10 Essential (primary) hypertension: Secondary | ICD-10-CM | POA: Diagnosis not present

## 2019-06-13 DIAGNOSIS — Z13220 Encounter for screening for lipoid disorders: Secondary | ICD-10-CM | POA: Diagnosis not present

## 2019-06-13 DIAGNOSIS — E559 Vitamin D deficiency, unspecified: Secondary | ICD-10-CM | POA: Diagnosis not present

## 2019-08-25 ENCOUNTER — Other Ambulatory Visit: Payer: Self-pay | Admitting: Family Medicine

## 2019-08-25 DIAGNOSIS — Z1231 Encounter for screening mammogram for malignant neoplasm of breast: Secondary | ICD-10-CM

## 2019-09-28 ENCOUNTER — Ambulatory Visit: Payer: BLUE CROSS/BLUE SHIELD

## 2019-10-09 ENCOUNTER — Other Ambulatory Visit: Payer: Self-pay

## 2019-10-09 ENCOUNTER — Ambulatory Visit
Admission: RE | Admit: 2019-10-09 | Discharge: 2019-10-09 | Disposition: A | Discharge status: Home / Self Care | Payer: BC Managed Care – PPO | Source: Ambulatory Visit | Attending: Family Medicine | Admitting: Family Medicine

## 2019-10-09 DIAGNOSIS — Z1231 Encounter for screening mammogram for malignant neoplasm of breast: Secondary | ICD-10-CM

## 2019-11-03 DIAGNOSIS — I10 Essential (primary) hypertension: Secondary | ICD-10-CM | POA: Diagnosis not present

## 2019-11-03 DIAGNOSIS — E559 Vitamin D deficiency, unspecified: Secondary | ICD-10-CM | POA: Diagnosis not present

## 2019-11-03 DIAGNOSIS — R002 Palpitations: Secondary | ICD-10-CM | POA: Diagnosis not present

## 2019-11-03 DIAGNOSIS — E669 Obesity, unspecified: Secondary | ICD-10-CM | POA: Diagnosis not present

## 2019-11-14 DIAGNOSIS — E559 Vitamin D deficiency, unspecified: Secondary | ICD-10-CM | POA: Diagnosis not present

## 2019-11-14 DIAGNOSIS — R002 Palpitations: Secondary | ICD-10-CM | POA: Diagnosis not present

## 2019-11-14 DIAGNOSIS — E669 Obesity, unspecified: Secondary | ICD-10-CM | POA: Diagnosis not present

## 2019-11-14 DIAGNOSIS — I10 Essential (primary) hypertension: Secondary | ICD-10-CM | POA: Diagnosis not present

## 2019-11-15 DIAGNOSIS — H35712 Central serous chorioretinopathy, left eye: Secondary | ICD-10-CM | POA: Diagnosis not present

## 2019-12-13 DIAGNOSIS — H35712 Central serous chorioretinopathy, left eye: Secondary | ICD-10-CM | POA: Diagnosis not present

## 2020-02-19 DIAGNOSIS — Z01419 Encounter for gynecological examination (general) (routine) without abnormal findings: Secondary | ICD-10-CM | POA: Diagnosis not present

## 2020-02-19 DIAGNOSIS — Z6835 Body mass index (BMI) 35.0-35.9, adult: Secondary | ICD-10-CM | POA: Diagnosis not present

## 2020-03-05 DIAGNOSIS — Z13220 Encounter for screening for lipoid disorders: Secondary | ICD-10-CM | POA: Diagnosis not present

## 2020-03-05 DIAGNOSIS — Z5181 Encounter for therapeutic drug level monitoring: Secondary | ICD-10-CM | POA: Diagnosis not present

## 2020-03-05 DIAGNOSIS — Z0000 Encounter for general adult medical examination without abnormal findings: Secondary | ICD-10-CM | POA: Diagnosis not present

## 2020-03-05 DIAGNOSIS — E559 Vitamin D deficiency, unspecified: Secondary | ICD-10-CM | POA: Diagnosis not present

## 2020-08-26 ENCOUNTER — Other Ambulatory Visit: Payer: Self-pay | Admitting: Family Medicine

## 2020-08-26 DIAGNOSIS — Z1231 Encounter for screening mammogram for malignant neoplasm of breast: Secondary | ICD-10-CM

## 2020-10-17 ENCOUNTER — Ambulatory Visit: Payer: BC Managed Care – PPO

## 2020-11-20 ENCOUNTER — Other Ambulatory Visit: Payer: Self-pay

## 2020-11-20 ENCOUNTER — Ambulatory Visit
Admission: RE | Admit: 2020-11-20 | Discharge: 2020-11-20 | Disposition: A | Discharge status: Home / Self Care | Payer: 59 | Source: Ambulatory Visit | Attending: Family Medicine | Admitting: Family Medicine

## 2020-11-20 DIAGNOSIS — Z1231 Encounter for screening mammogram for malignant neoplasm of breast: Secondary | ICD-10-CM

## 2020-11-22 ENCOUNTER — Ambulatory Visit: Payer: BC Managed Care – PPO

## 2021-10-07 ENCOUNTER — Other Ambulatory Visit: Payer: Self-pay | Admitting: Family Medicine

## 2021-10-07 DIAGNOSIS — Z1231 Encounter for screening mammogram for malignant neoplasm of breast: Secondary | ICD-10-CM

## 2021-11-21 ENCOUNTER — Ambulatory Visit: Payer: Self-pay

## 2021-11-24 ENCOUNTER — Ambulatory Visit
Admission: RE | Admit: 2021-11-24 | Discharge: 2021-11-24 | Disposition: A | Discharge status: Home / Self Care | Payer: BC Managed Care – PPO | Source: Ambulatory Visit | Attending: Family Medicine | Admitting: Family Medicine

## 2021-11-24 DIAGNOSIS — Z1231 Encounter for screening mammogram for malignant neoplasm of breast: Secondary | ICD-10-CM | POA: Diagnosis not present

## 2022-01-21 DIAGNOSIS — E7800 Pure hypercholesterolemia, unspecified: Secondary | ICD-10-CM | POA: Diagnosis not present

## 2022-01-21 DIAGNOSIS — R7303 Prediabetes: Secondary | ICD-10-CM | POA: Diagnosis not present

## 2022-01-21 DIAGNOSIS — I10 Essential (primary) hypertension: Secondary | ICD-10-CM | POA: Diagnosis not present

## 2022-06-15 DIAGNOSIS — Z6834 Body mass index (BMI) 34.0-34.9, adult: Secondary | ICD-10-CM | POA: Diagnosis not present

## 2022-06-15 DIAGNOSIS — Z01419 Encounter for gynecological examination (general) (routine) without abnormal findings: Secondary | ICD-10-CM | POA: Diagnosis not present

## 2022-06-15 DIAGNOSIS — Z124 Encounter for screening for malignant neoplasm of cervix: Secondary | ICD-10-CM | POA: Diagnosis not present

## 2022-06-24 DIAGNOSIS — H35712 Central serous chorioretinopathy, left eye: Secondary | ICD-10-CM | POA: Diagnosis not present

## 2022-12-29 ENCOUNTER — Other Ambulatory Visit: Payer: Self-pay | Admitting: Family Medicine

## 2022-12-29 DIAGNOSIS — Z1231 Encounter for screening mammogram for malignant neoplasm of breast: Secondary | ICD-10-CM

## 2023-01-26 ENCOUNTER — Ambulatory Visit: Payer: BC Managed Care – PPO

## 2023-02-09 ENCOUNTER — Ambulatory Visit: Payer: BC Managed Care – PPO

## 2023-02-13 ENCOUNTER — Ambulatory Visit
Admission: RE | Admit: 2023-02-13 | Discharge: 2023-02-13 | Disposition: A | Discharge status: Home / Self Care | Payer: Managed Care, Other (non HMO) | Source: Ambulatory Visit | Attending: Family Medicine | Admitting: Family Medicine

## 2023-02-13 DIAGNOSIS — Z1231 Encounter for screening mammogram for malignant neoplasm of breast: Secondary | ICD-10-CM

## 2023-10-26 ENCOUNTER — Other Ambulatory Visit: Payer: Self-pay | Admitting: Obstetrics & Gynecology

## 2023-10-26 DIAGNOSIS — Z1231 Encounter for screening mammogram for malignant neoplasm of breast: Secondary | ICD-10-CM

## 2024-02-14 ENCOUNTER — Ambulatory Visit

## 2024-05-01 ENCOUNTER — Ambulatory Visit

## 2024-06-02 ENCOUNTER — Ambulatory Visit

## 2024-06-13 ENCOUNTER — Ambulatory Visit (HOSPITAL_BASED_OUTPATIENT_CLINIC_OR_DEPARTMENT_OTHER)

## 2024-06-19 ENCOUNTER — Ambulatory Visit (HOSPITAL_BASED_OUTPATIENT_CLINIC_OR_DEPARTMENT_OTHER): Payer: Self-pay

## 2024-06-23 ENCOUNTER — Ambulatory Visit
